# Patient Record
Sex: Male | Born: 2000 | Race: White | Hispanic: No | Marital: Single
Health system: Southern US, Community
[De-identification: ages and names within clinical notes are randomized; demographics above are authoritative.]

## PROBLEM LIST (undated history)

## (undated) DIAGNOSIS — F909 Attention-deficit hyperactivity disorder, unspecified type: Secondary | ICD-10-CM

---
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
## 2021-03-22 ENCOUNTER — Emergency Department (HOSPITAL_COMMUNITY)
Admission: EM | Admit: 2021-03-22 | Discharge: 2021-03-22 | Disposition: A | Discharge status: Home / Self Care | Payer: Medicaid Other | Attending: Emergency Medicine | Admitting: Emergency Medicine

## 2021-03-22 ENCOUNTER — Other Ambulatory Visit: Payer: Self-pay

## 2021-03-22 DIAGNOSIS — Z23 Encounter for immunization: Secondary | ICD-10-CM | POA: Insufficient documentation

## 2021-03-22 DIAGNOSIS — X088XXA Exposure to other specified smoke, fire and flames, initial encounter: Secondary | ICD-10-CM | POA: Insufficient documentation

## 2021-03-22 DIAGNOSIS — Z7722 Contact with and (suspected) exposure to environmental tobacco smoke (acute) (chronic): Secondary | ICD-10-CM | POA: Insufficient documentation

## 2021-03-22 DIAGNOSIS — T22212A Burn of second degree of left forearm, initial encounter: Secondary | ICD-10-CM | POA: Insufficient documentation

## 2021-03-22 DIAGNOSIS — T22011A Burn of unspecified degree of right forearm, initial encounter: Secondary | ICD-10-CM | POA: Diagnosis present

## 2021-03-22 MED ORDER — HYDROCODONE-ACETAMINOPHEN 5-325 MG PO TABS: 1.0000 | ORAL_TABLET | ORAL | 0 refills | Status: AC | PRN

## 2021-03-22 MED ORDER — TETANUS-DIPHTH-ACELL PERTUSSIS 5-2.5-18.5 LF-MCG/0.5 IM SUSY
0.5000 mL | PREFILLED_SYRINGE | Freq: Once | INTRAMUSCULAR | Status: AC
  Administered 2021-03-22: 0.5 mL via INTRAMUSCULAR
  Filled 2021-03-22: qty 0.5

## 2021-03-22 MED ORDER — OXYCODONE-ACETAMINOPHEN 5-325 MG PO TABS
1.0000 | ORAL_TABLET | Freq: Once | ORAL | Status: AC
Start: 2021-03-22 — End: 2021-03-22
  Administered 2021-03-22: 1 via ORAL
  Filled 2021-03-22: qty 1

## 2021-03-22 MED ORDER — DOUBLE ANTIBIOTIC 500-10000 UNIT/GM EX OINT
TOPICAL_OINTMENT | Freq: Once | CUTANEOUS | Status: AC
  Administered 2021-03-22: 1 via TOPICAL
  Filled 2021-03-22: qty 2
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
## 2021-03-22 NOTE — Discharge Instructions (Addendum)
Return if any problems.
